# Patient Record
Sex: Male | Born: 1950 | Race: White | Hispanic: No | Marital: Married | Smoking: Never smoker
Health system: Southern US, Community
[De-identification: ages and names within clinical notes are randomized; demographics above are authoritative.]

## PROBLEM LIST (undated history)

## (undated) DIAGNOSIS — I10 Essential (primary) hypertension: Secondary | ICD-10-CM

## (undated) DIAGNOSIS — E785 Hyperlipidemia, unspecified: Secondary | ICD-10-CM

## (undated) HISTORY — PX: TONSILLECTOMY: SUR1361

## (undated) HISTORY — PX: ARTHROSCOPIC REPAIR ACL: SUR80

## (undated) HISTORY — PX: KNEE SURGERY: SHX244

## (undated) HISTORY — PX: APPENDECTOMY: SHX54

---

## 2014-08-19 ENCOUNTER — Emergency Department (HOSPITAL_COMMUNITY): Payer: Managed Care, Other (non HMO)

## 2014-08-19 ENCOUNTER — Encounter (HOSPITAL_COMMUNITY): Payer: Self-pay | Admitting: Emergency Medicine

## 2014-08-19 ENCOUNTER — Emergency Department (HOSPITAL_COMMUNITY)
Admission: EM | Admit: 2014-08-19 | Discharge: 2014-08-19 | Disposition: A | Discharge status: Home / Self Care | Payer: Managed Care, Other (non HMO) | Attending: Emergency Medicine | Admitting: Emergency Medicine

## 2014-08-19 DIAGNOSIS — Z8639 Personal history of other endocrine, nutritional and metabolic disease: Secondary | ICD-10-CM | POA: Diagnosis not present

## 2014-08-19 DIAGNOSIS — Z79899 Other long term (current) drug therapy: Secondary | ICD-10-CM | POA: Insufficient documentation

## 2014-08-19 DIAGNOSIS — Y9289 Other specified places as the place of occurrence of the external cause: Secondary | ICD-10-CM | POA: Insufficient documentation

## 2014-08-19 DIAGNOSIS — IMO0002 Reserved for concepts with insufficient information to code with codable children: Secondary | ICD-10-CM

## 2014-08-19 DIAGNOSIS — S91021A Laceration with foreign body, right ankle, initial encounter: Secondary | ICD-10-CM | POA: Diagnosis present

## 2014-08-19 DIAGNOSIS — W25XXXA Contact with sharp glass, initial encounter: Secondary | ICD-10-CM | POA: Diagnosis not present

## 2014-08-19 DIAGNOSIS — Y9389 Activity, other specified: Secondary | ICD-10-CM | POA: Diagnosis not present

## 2014-08-19 DIAGNOSIS — I10 Essential (primary) hypertension: Secondary | ICD-10-CM | POA: Insufficient documentation

## 2014-08-19 DIAGNOSIS — Y998 Other external cause status: Secondary | ICD-10-CM | POA: Insufficient documentation

## 2014-08-19 HISTORY — DX: Hyperlipidemia, unspecified: E78.5

## 2014-08-19 HISTORY — DX: Essential (primary) hypertension: I10

## 2014-08-19 MED ORDER — BACITRACIN ZINC 500 UNIT/GM EX OINT
TOPICAL_OINTMENT | CUTANEOUS | Status: AC
  Filled 2014-08-19: qty 0.9

## 2014-08-19 MED ORDER — LIDOCAINE HCL (PF) 1 % IJ SOLN
2.0000 mL | Freq: Once | INTRAMUSCULAR | Status: DC
  Filled 2014-08-19: qty 5

## 2014-08-19 NOTE — ED Provider Notes (Addendum)
CSN: [id number]     Arrival date & time 08/19/14  2011 History  This chart was scribed for Earley FavorGail Roylee Chaffin, NP, working with Linwood DibblesJon Knapp, MD by Leona CarryG. Clay Sherrill, ED Scribe. The patient was seen in [facility]. The patient's care was started at 9:10 PM.     Chief Complaint  Patient presents with  . Laceration   Patient is a 64 y.o. male presenting with skin laceration. The history is provided by the patient. No language interpreter was used.  Laceration  HPI Comments: Melinda CrutchJames Haese is a 64 y.o. male who presents to the Emergency Department complaining of a laceration on his right ankle. Patient reports that he dropped an unopened beer bottle and a shard of glass hit him in the ankle. Patient was able to control the bleeding on his own using a paper towel and saran wrap. He reports that his last tetanus shot was 6-7 years ago.   Past Medical History  Diagnosis Date  . Hypertension   . Hyperlipidemia    Past Surgical History  Procedure Laterality Date  . Tonsillectomy    . Appendectomy    . Knee surgery    . Arthroscopic repair acl     No family history on file. History  Substance Use Topics  . Smoking status: Never Smoker   . Smokeless tobacco: Not on file  . Alcohol Use: Yes     Comment: occ    Review of Systems    Allergies  Review of patient's allergies indicates no known allergies.  Home Medications   Prior to Admission medications   Medication Sig Start Date End Date Taking? Authorizing Provider  amLODipine (NORVASC) 5 MG tablet Take 5 mg by mouth daily. 06/16/14  Yes Historical Provider, MD  losartan-hydrochlorothiazide (HYZAAR) 100-25 MG per tablet Take 1 tablet by mouth daily. 06/16/14  Yes Historical Provider, MD   Triage Vitals: BP 164/82 mmHg  Pulse 60  Temp(Src) 98 F (36.7 C)  Resp 18  SpO2 100% Physical Exam  ED Course  LACERATION REPAIR Date/Time: 08/19/2014 9:45 PM Performed by: Earley FavorSCHULZ, Nakiesha Rumsey Authorized by: Earley FavorSCHULZ, Zhoey Blackstock Consent: Verbal consent obtained.  Written consent not obtained. Risks and benefits: risks, benefits and alternatives were discussed Consent given by: patient Patient understanding: patient states understanding of the procedure being performed Patient identity confirmed: verbally with patient Body area: lower extremity Location details: right lower leg Laceration length: 1 cm Foreign bodies: glass Tendon involvement: none Nerve involvement: none Vascular damage: no Anesthesia: local infiltration Local anesthetic: lidocaine 1% without epinephrine Anesthetic total: 1 ml Patient sedated: no Preparation: Patient was prepped and draped in the usual sterile fashion. Irrigation solution: saline Irrigation method: syringe Amount of cleaning: standard Debridement: none Degree of undermining: none Skin closure: 4-0 Prolene Number of sutures: 4 Technique: simple Approximation: close Approximation difficulty: simple Dressing: antibiotic ointment and pressure dressing Patient tolerance: Patient tolerated the procedure well with no immediate complications   (including critical care time) DIAGNOSTIC STUDIES: Oxygen Saturation is 100% on room air, normal by my interpretation.    COORDINATION OF CARE:    Labs Review Labs Reviewed - No data to display  Imaging Review No results found.   EKG Interpretation None      MDM   Final diagnoses:  Laceration   I personally performed the services described in this documentation, which was scribed in my presence. The recorded information has been reviewed and is accurate.  Earley FavorGail Kenzie Flakes, NP 08/19/14 2147  Linwood DibblesJon Knapp, MD 08/23/14 1441  Earley FavorGail Rudell Ortman,  NP 08/31/14 1949  Earley Favor, NP 08/31/14 1949  Linwood Dibbles, MD 08/31/14 [phone]

## 2014-08-19 NOTE — Discharge Instructions (Signed)
The area on a daily basis soap and water, you may shower, but no standing and pools of water, sutures should be removed in 10 days for the next several days while you're up and about and being very active after changing the dressing, applying a small amount of enzymatic ointment applied the Ace bandage for extra support

## 2014-08-19 NOTE — ED Notes (Signed)
Pt in town for wedding, while holding full unopened beer bottle dropped bottle on ground, bottle shattered, laceration from shard to R ankle. Bleeding controlled at this time with paper towel and saran wrap.

## 2016-02-09 IMAGING — CR DG ANKLE 2V *R*
2 series · 2 of 2 positions shown · non-contrast
Comparison: None.

CLINICAL DATA: Ankle laceration from dropped [employer]. Evaluate
for foreign body.

EXAM:
RIGHT ANKLE - 2 VIEW

[x ankle ap right]
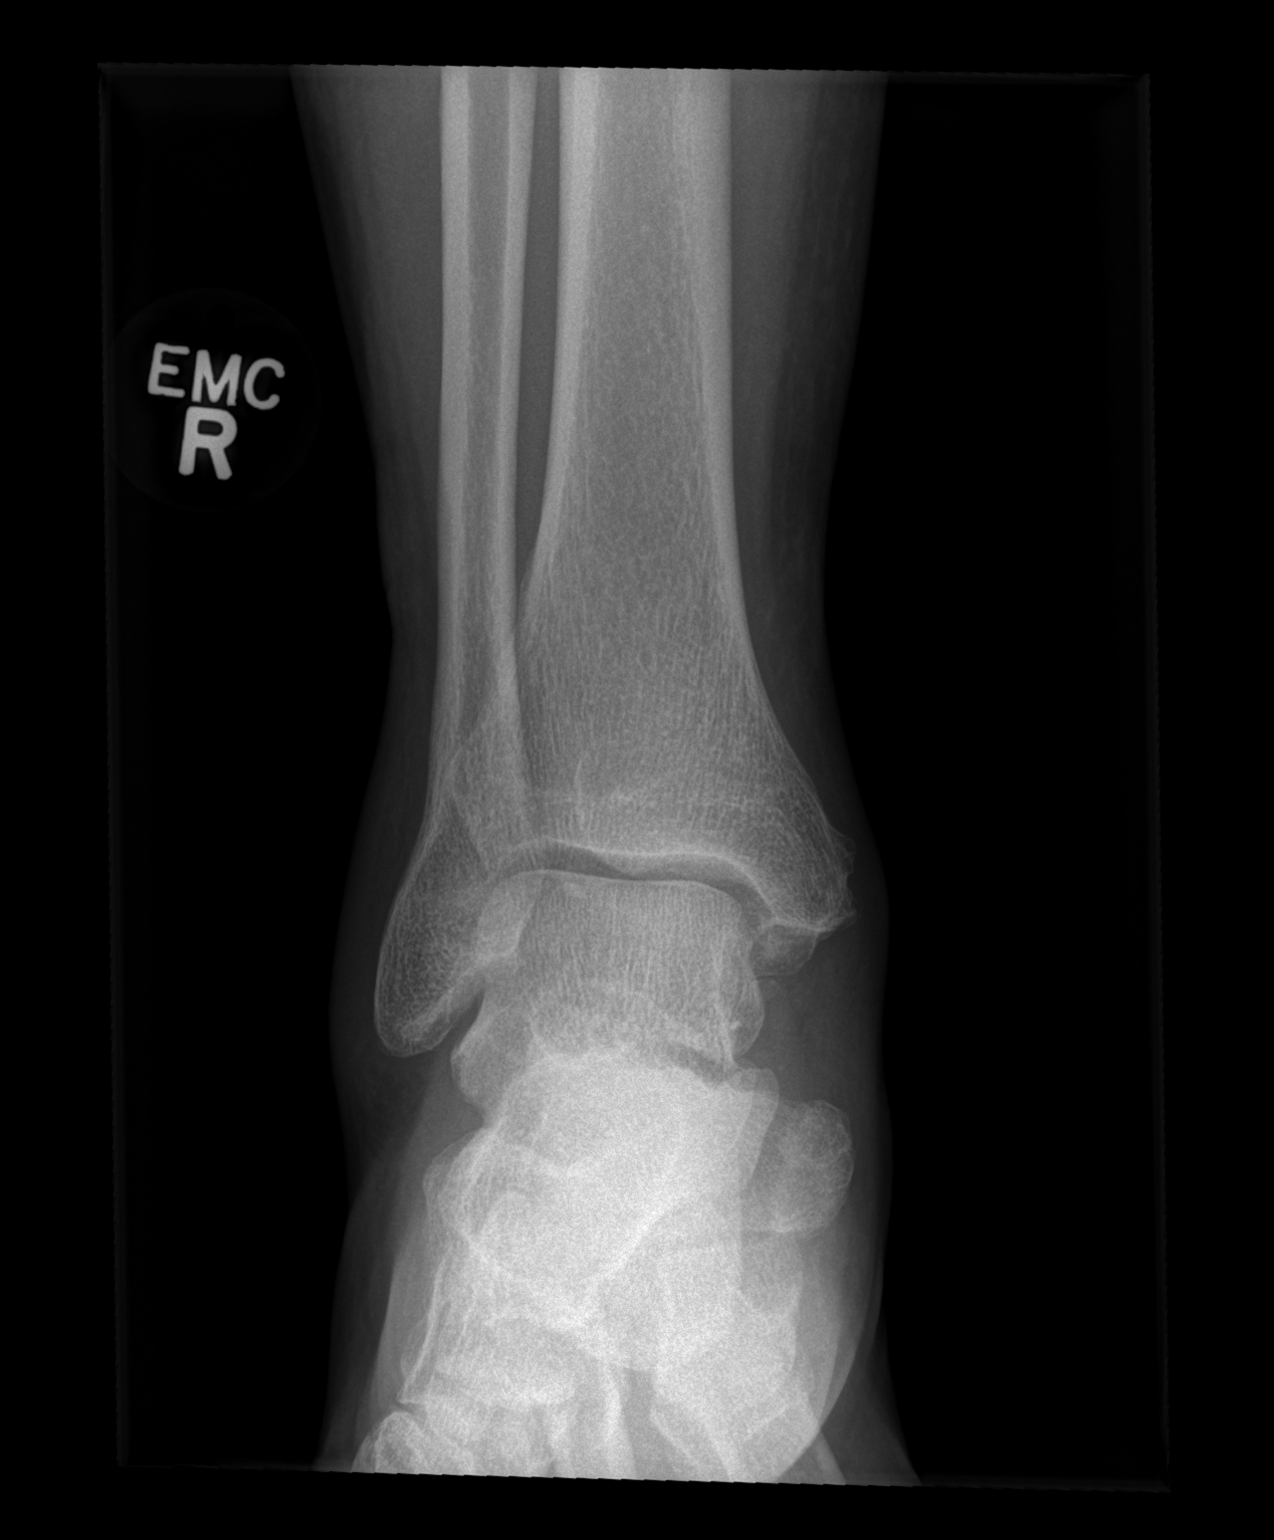

[x ankle lat right]
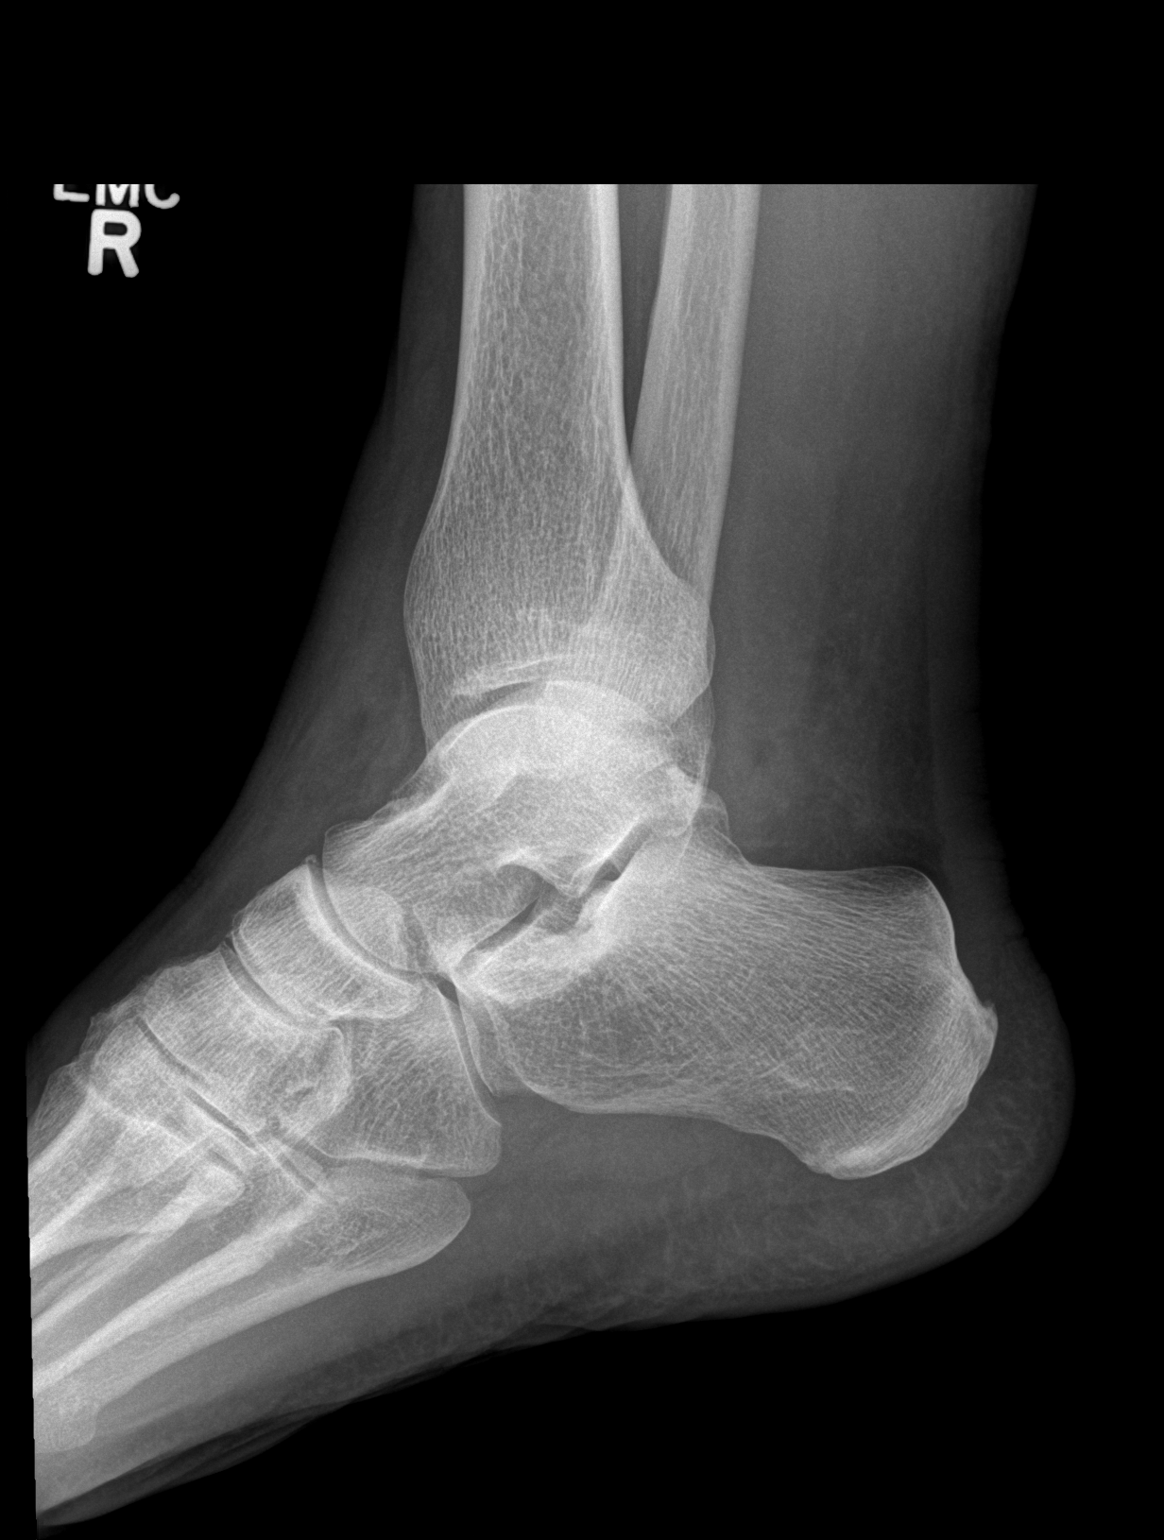

[2 of 2 positions shown; findings below may reference images not displayed]

FINDINGS: No opaque foreign body. There is an ossicle at the medial malleolus,
no acute fracture. No subcutaneous gas.
IMPRESSION: No fracture or opaque foreign body.
